# Patient Record
Sex: Female | Born: 1975 | Race: Black or African American | Hispanic: No | Marital: Married | Smoking: Never smoker
Health system: Southern US, Community
[De-identification: ages and names within clinical notes are randomized; demographics above are authoritative.]

## PROBLEM LIST (undated history)

## (undated) DIAGNOSIS — I10 Essential (primary) hypertension: Secondary | ICD-10-CM

---

## 2015-05-12 ENCOUNTER — Emergency Department (HOSPITAL_COMMUNITY): Payer: BC Managed Care – PPO

## 2015-05-12 ENCOUNTER — Emergency Department (HOSPITAL_COMMUNITY)
Admission: EM | Admit: 2015-05-12 | Discharge: 2015-05-12 | Disposition: A | Discharge status: Home / Self Care | Payer: BC Managed Care – PPO | Attending: Emergency Medicine | Admitting: Emergency Medicine

## 2015-05-12 ENCOUNTER — Encounter (HOSPITAL_COMMUNITY): Payer: Self-pay | Admitting: Nurse Practitioner

## 2015-05-12 DIAGNOSIS — Y998 Other external cause status: Secondary | ICD-10-CM | POA: Diagnosis not present

## 2015-05-12 DIAGNOSIS — I10 Essential (primary) hypertension: Secondary | ICD-10-CM | POA: Diagnosis not present

## 2015-05-12 DIAGNOSIS — S4991XA Unspecified injury of right shoulder and upper arm, initial encounter: Secondary | ICD-10-CM | POA: Diagnosis present

## 2015-05-12 DIAGNOSIS — Y9389 Activity, other specified: Secondary | ICD-10-CM | POA: Insufficient documentation

## 2015-05-12 DIAGNOSIS — Y92410 Unspecified street and highway as the place of occurrence of the external cause: Secondary | ICD-10-CM | POA: Insufficient documentation

## 2015-05-12 DIAGNOSIS — S199XXA Unspecified injury of neck, initial encounter: Secondary | ICD-10-CM | POA: Diagnosis not present

## 2015-05-12 HISTORY — DX: Essential (primary) hypertension: I10

## 2015-05-12 MED ORDER — HYDROCODONE-ACETAMINOPHEN 5-325 MG PO TABS: 1.0000 | ORAL_TABLET | Freq: Four times a day (QID) | ORAL | Status: AC | PRN

## 2015-05-12 MED ORDER — IBUPROFEN 600 MG PO TABS: 600.0000 mg | ORAL_TABLET | Freq: Four times a day (QID) | ORAL | Status: AC | PRN

## 2015-05-12 MED ORDER — HYDROCODONE-ACETAMINOPHEN 5-325 MG PO TABS
1.0000 | ORAL_TABLET | Freq: Once | ORAL | Status: AC
  Administered 2015-05-12: 1 via ORAL
  Filled 2015-05-12: qty 1
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
## 2015-05-12 NOTE — Discharge Instructions (Signed)
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
